# Patient Record
Sex: Female | Born: 1970 | Race: Black or African American | Hispanic: No | Smoking: Never smoker
Health system: Southern US, Community
[De-identification: ages and names within clinical notes are randomized; demographics above are authoritative.]

## PROBLEM LIST (undated history)

## (undated) ENCOUNTER — Emergency Department: Discharge status: Absent without leave | Payer: Self-pay

## (undated) DIAGNOSIS — I10 Essential (primary) hypertension: Secondary | ICD-10-CM

---

## 1999-05-30 ENCOUNTER — Inpatient Hospital Stay (HOSPITAL_COMMUNITY): Admission: AD | Admit: 1999-05-30 | Discharge: 1999-05-30 | Payer: Self-pay | Admitting: Obstetrics & Gynecology

## 2019-01-03 ENCOUNTER — Emergency Department (HOSPITAL_BASED_OUTPATIENT_CLINIC_OR_DEPARTMENT_OTHER): Payer: BC Managed Care – PPO

## 2019-01-03 ENCOUNTER — Emergency Department (HOSPITAL_BASED_OUTPATIENT_CLINIC_OR_DEPARTMENT_OTHER)
Admission: EM | Admit: 2019-01-03 | Discharge: 2019-01-04 | Disposition: A | Discharge status: Home / Self Care | Payer: BC Managed Care – PPO | Attending: Emergency Medicine | Admitting: Emergency Medicine

## 2019-01-03 ENCOUNTER — Encounter (HOSPITAL_BASED_OUTPATIENT_CLINIC_OR_DEPARTMENT_OTHER): Payer: Self-pay | Admitting: *Deleted

## 2019-01-03 ENCOUNTER — Other Ambulatory Visit: Payer: Self-pay

## 2019-01-03 DIAGNOSIS — G479 Sleep disorder, unspecified: Secondary | ICD-10-CM | POA: Diagnosis not present

## 2019-01-03 DIAGNOSIS — R0602 Shortness of breath: Secondary | ICD-10-CM | POA: Diagnosis present

## 2019-01-03 DIAGNOSIS — I10 Essential (primary) hypertension: Secondary | ICD-10-CM | POA: Diagnosis not present

## 2019-01-03 HISTORY — DX: Essential (primary) hypertension: I10

## 2019-01-03 NOTE — ED Triage Notes (Signed)
Feels like she cannot get enough air. She is in no resp distress. She has had 3 episodes of the same. She has an appointment to see her MD tomorrow.

## 2019-01-04 LAB — CBG MONITORING, ED: GLUCOSE-CAPILLARY: 85 mg/dL (ref 70–99)

## 2019-01-04 NOTE — Progress Notes (Signed)
RT ambulated patient while monitoring pulse oximetry. Patient tolerated well. Patient maintained sats 96-1000%,

## 2019-01-04 NOTE — ED Provider Notes (Signed)
[facility] EMERGENCY DEPARTMENT Provider Note   CSN: [id number] Arrival date & time: 01/03/19  2026     History   Chief Complaint No chief complaint on file.   HPI Susan Mayo is a 48 y.o. female.  The history is provided by the patient.  Shortness of Breath  Severity:  Moderate Onset quality:  Gradual Duration:  1 week Timing:  Intermittent Progression:  Worsening Chronicity:  New Relieved by:  Nothing Worsened by:  Nothing Associated symptoms: no abdominal pain, no chest pain, no cough, no fever, no hemoptysis, no PND, no syncope and no vomiting   Associated symptoms comment:  "indigestion"  Risk factors: no hx of PE/DVT, no prolonged immobilization, no recent surgery and no tobacco use   Patient reports episodes of shortness of breath for over a week.  It is intermittent. She reports it for started around February 3. She will have episodes where she cannot get enough air.  She also has episodes of indigestion.  No fever/vomiting.  No hemoptysis.  No other chest pain.  No leg swelling.   No travel. No previous cardiac history.  No family history of premature cardiac disease Past Medical History:  Diagnosis Date  . Hypertension     There are no active problems to display for this patient.   History reviewed. No pertinent surgical history.   OB History   No obstetric history on file.      Home Medications    Prior to Admission medications   Medication Sig Start Date End Date Taking? Authorizing Provider  Ferrous Sulfate (IRON PO) Take by mouth.   Yes [provider]  Potassium (POTASSIMIN PO) Take by mouth.   Yes [provider]    Family History No family history on file.  Social History Social History   Tobacco Use  . Smoking status: Never Smoker  . Smokeless tobacco: Never Used  Substance Use Topics  . Alcohol use: Not Currently  . Drug use: Never     Allergies   Patient has no known allergies.   Review  of Systems Review of Systems  Constitutional: Negative for fever.  Respiratory: Positive for shortness of breath. Negative for cough and hemoptysis.   Cardiovascular: Negative for chest pain, leg swelling, syncope and PND.  Gastrointestinal: Negative for abdominal pain and vomiting.  Neurological:       Intermittent "tingling" in her left hand with computer use  Psychiatric/Behavioral: Positive for sleep disturbance.  All other systems reviewed and are negative.    Physical Exam Updated Vital Signs BP 118/64 (BP Location: Left Arm)   Pulse 78   Temp 98.7 F (37.1 C) (Oral)   Resp 18   Ht 1.676 m (5\' 6" )   Wt 91.6 kg   LMP 12/27/2018   SpO2 100%   BMI 32.60 kg/m   Physical Exam CONSTITUTIONAL: Well developed/well nourished HEAD: Normocephalic/atraumatic EYES: EOMI/PERRL ENMT: Mucous membranes moist NECK: supple no meningeal signs, no JVD SPINE/BACK:entire spine nontender CV: S1/S2 noted, no murmurs/rubs/gallops noted LUNGS: Lungs are clear to auscultation bilaterally, no apparent distress ABDOMEN: soft, nontender, no rebound or guarding, bowel sounds noted throughout abdomen GU:no cva tenderness NEURO: Pt is awake/alert/appropriate, moves all extremitiesx4.  No facial droop.   EXTREMITIES: pulses normal/equal, full ROM, no lower extremity edema SKIN: warm, color normal PSYCH: no abnormalities of mood noted, alert and oriented to situation  ED Treatments / Results  Labs (all labs ordered are listed, but only abnormal results are displayed) Labs Reviewed  CBG MONITORING, ED    EKG EKG Interpretation  Date/Time:  Monday January 03 2019 20:44:10 EST Ventricular Rate:  82 PR Interval:  164 QRS Duration: 82 QT Interval:  376 QTC Calculation: 439 R Axis:   81 Text Interpretation:  Normal sinus rhythm with sinus arrhythmia Normal ECG No previous ECGs available Confirmed by Zadie RhineWickline, Reon Hunley ([id number]) on 01/03/2019 11:31:47 PM   Radiology Dg Chest 2 View  Result  Date: 01/03/2019 CLINICAL DATA:  Worsening shortness of breath for 7 days EXAM: CHEST - 2 VIEW COMPARISON:  None. FINDINGS: The heart size and mediastinal contours are within normal limits. Both lungs are clear. The visualized skeletal structures are unremarkable. IMPRESSION: No active cardiopulmonary disease. Electronically Signed   By: Alcide CleverMark  Lukens M.D.   On: 01/03/2019 23:59    Procedures Procedures (including critical care time)  Medications Ordered in ED Medications - No data to display   Initial Impression / Assessment and Plan / ED Course  I have reviewed the triage vital signs and the nursing notes.  Pertinent labs & imaging results that were available during my care of the patient were reviewed by me and considered in my medical decision making (see chart for details).     12:07 AM Presents for episodes of unable to get air/shortness of breath.  She is currently PERC negative. She is well-appearing.  EKG is unremarkable.  She does report recent sleep disturbance and some anxiety.  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 My suspicion for ACS/PE is low She has PCP followup later today 12:42 AM Ambulated without difficulty.  No hypoxia.  She is overall well-appearing.  Will discharge home. Final Clinical Impressions(s) / ED Diagnoses   Final diagnoses:  Shortness of breath    ED Discharge Orders    None       Zadie RhineWickline, Briscoe Daniello, MD 01/04/19 [id number]

## 2019-12-22 IMAGING — DX DG CHEST 2V
2 series · 2 of 2 positions shown · non-contrast
Comparison: None.

CLINICAL DATA: Worsening shortness of breath for 7 days

EXAM:
CHEST - 2 VIEW

[chest pa]
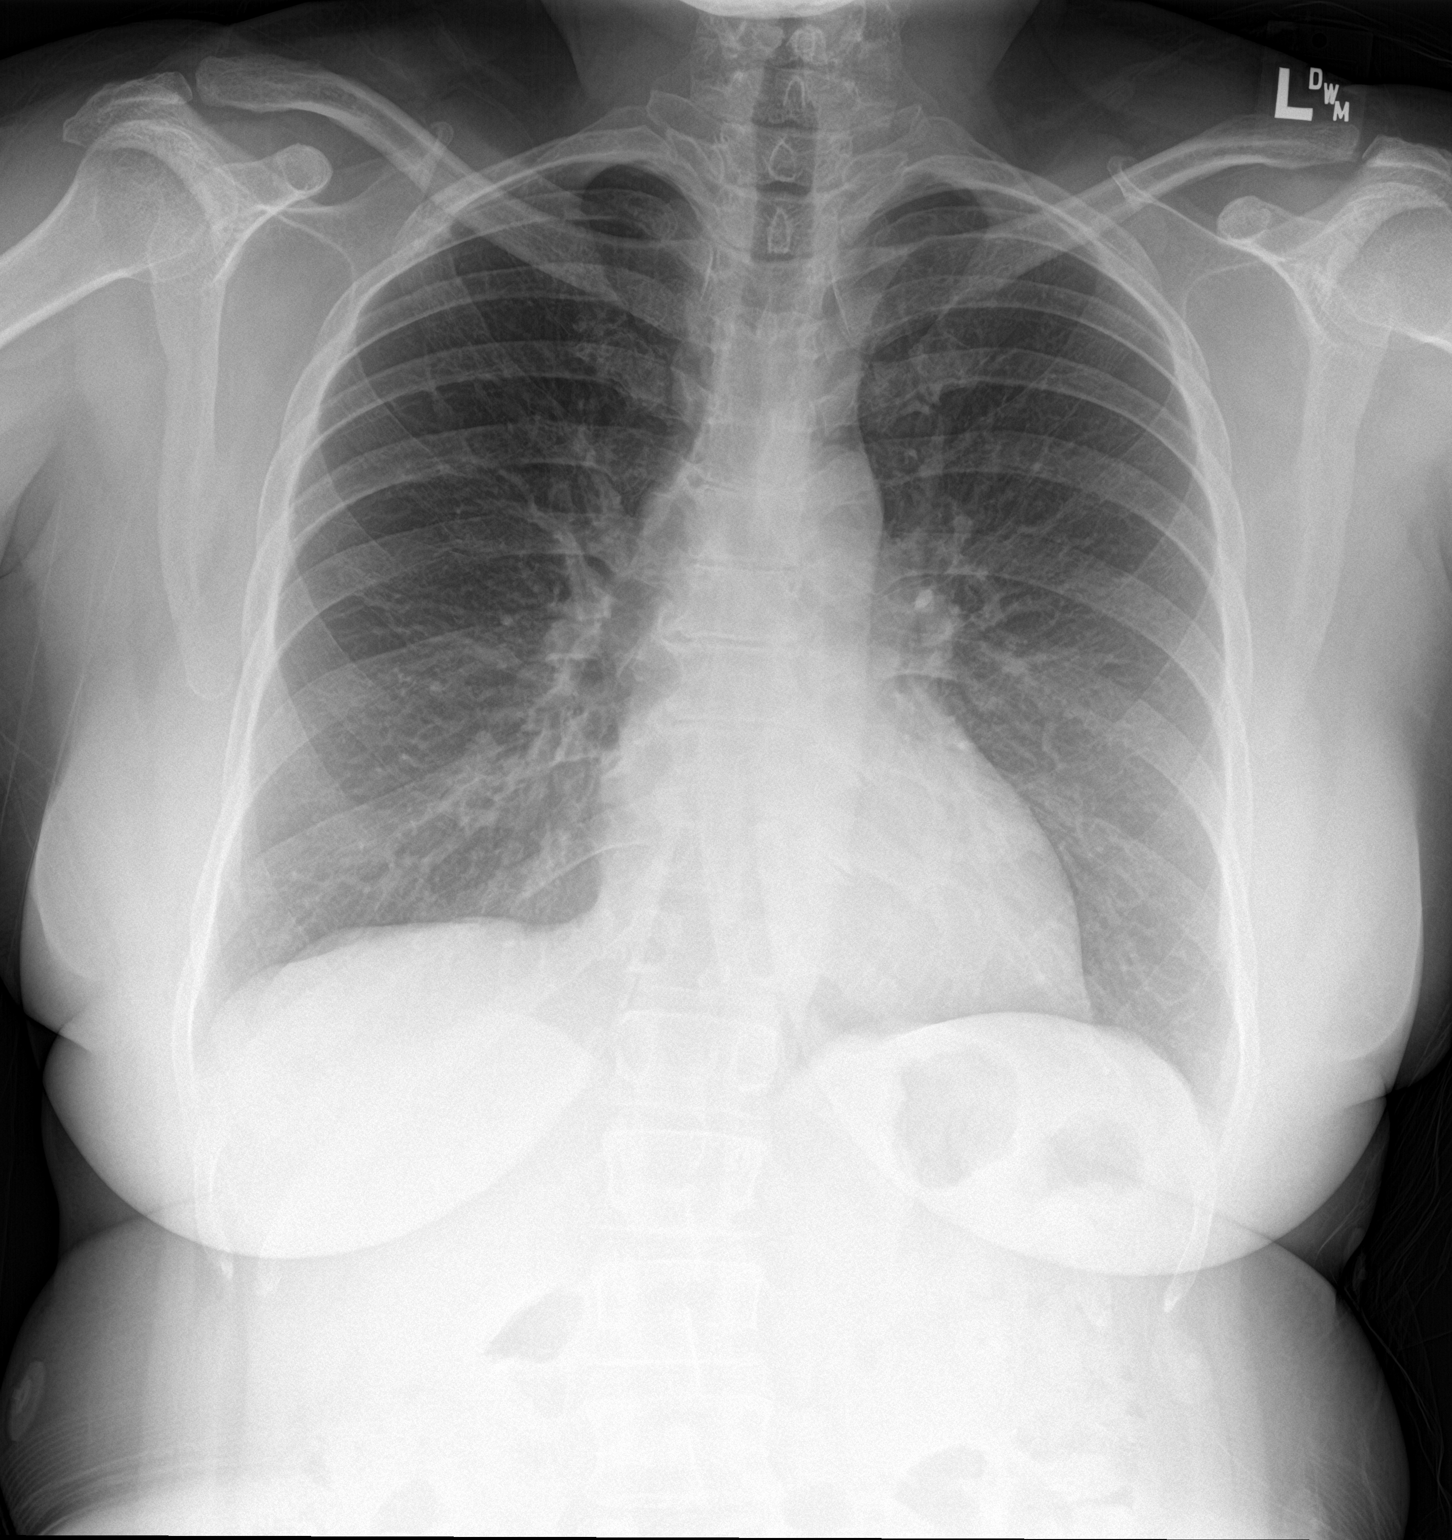

[chest lat]
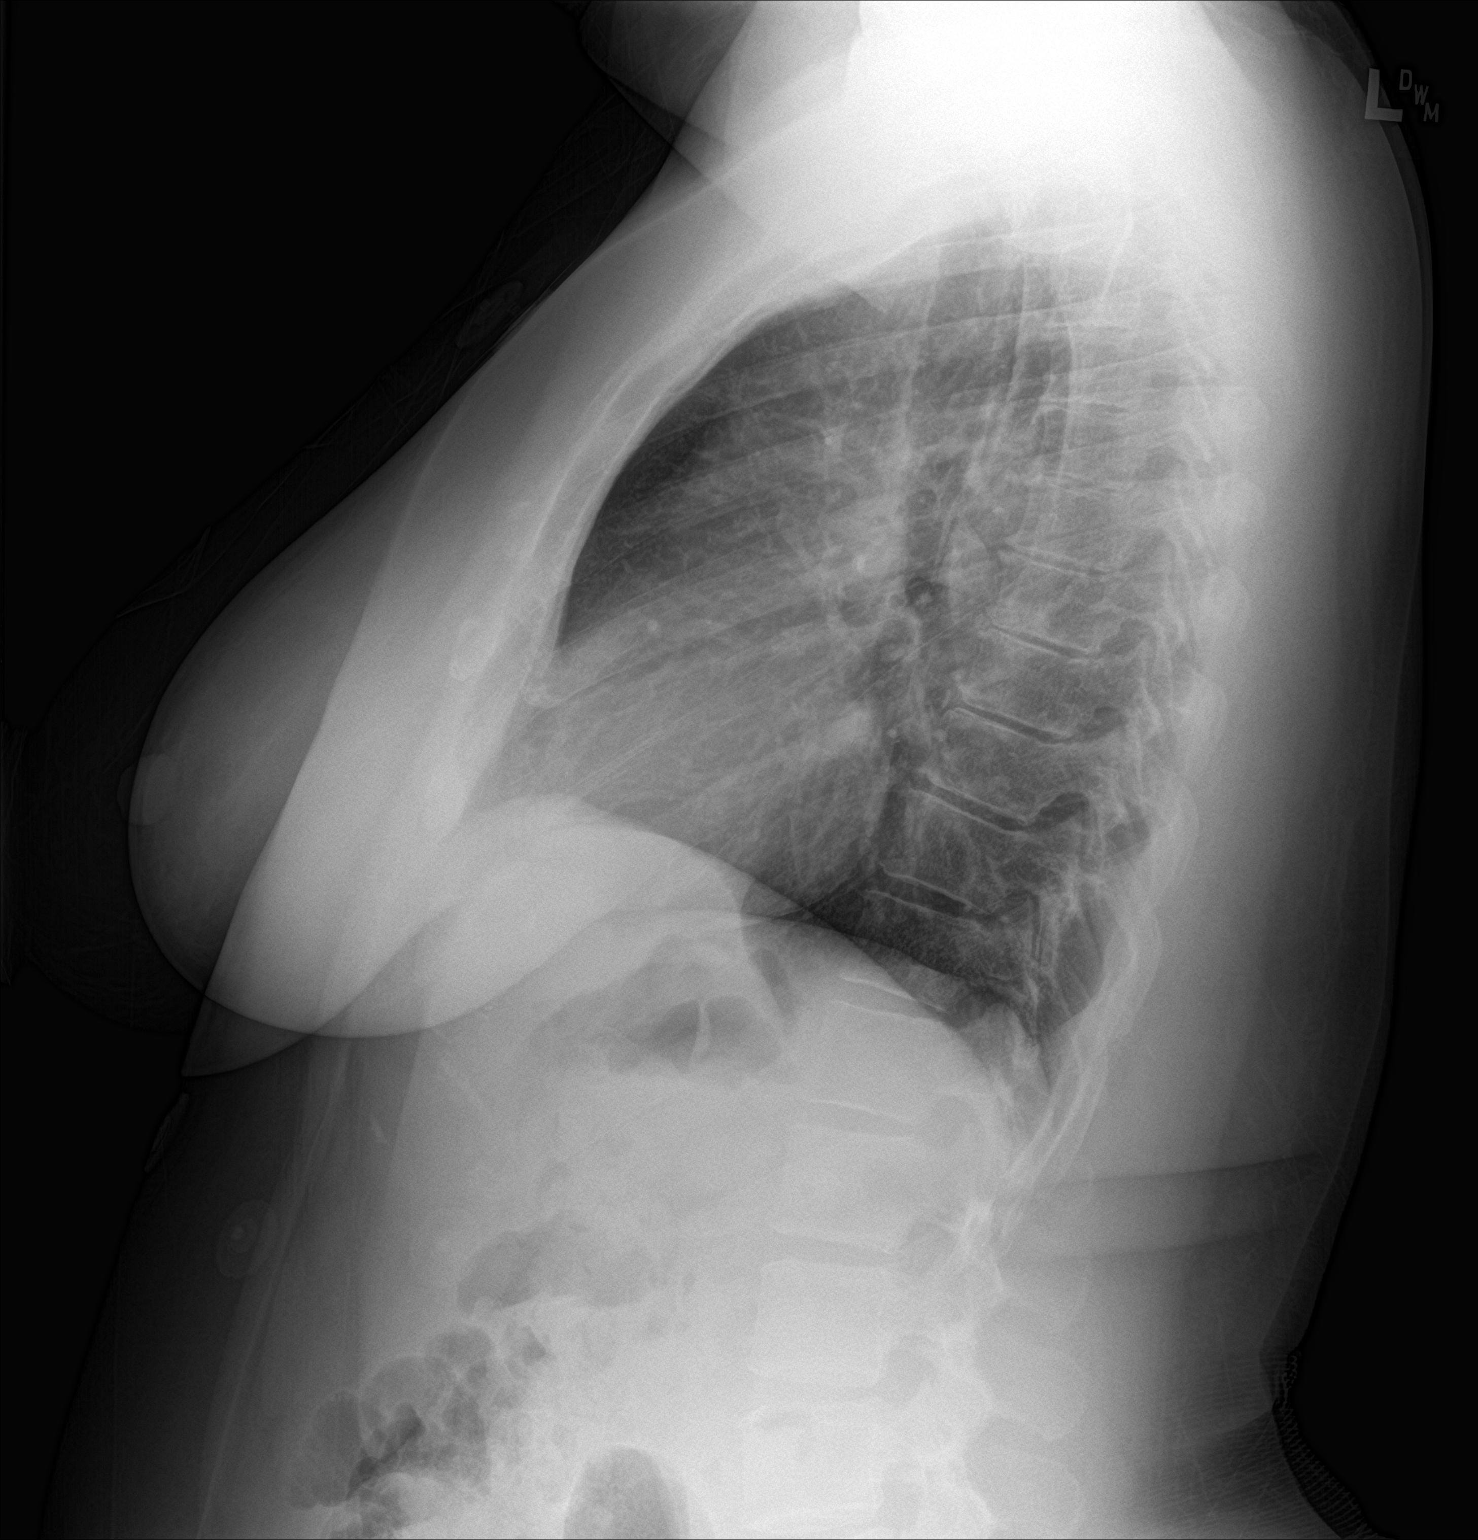

[2 of 2 positions shown; findings below may reference images not displayed]

FINDINGS: The heart size and mediastinal contours are within normal limits.
Both lungs are clear. The visualized skeletal structures are
unremarkable.
IMPRESSION: No active cardiopulmonary disease.

## 2021-06-04 ENCOUNTER — Other Ambulatory Visit: Payer: Self-pay

## 2021-06-04 ENCOUNTER — Other Ambulatory Visit (HOSPITAL_COMMUNITY): Payer: Self-pay | Admitting: Medical

## 2021-06-04 ENCOUNTER — Ambulatory Visit (HOSPITAL_COMMUNITY)
Admission: RE | Admit: 2021-06-04 | Discharge: 2021-06-04 | Disposition: A | Discharge status: Home / Self Care | Payer: BC Managed Care – PPO | Source: Ambulatory Visit | Attending: Medical | Admitting: Medical

## 2021-06-04 DIAGNOSIS — M79604 Pain in right leg: Secondary | ICD-10-CM | POA: Insufficient documentation

## 2021-06-04 DIAGNOSIS — M79605 Pain in left leg: Secondary | ICD-10-CM | POA: Insufficient documentation

## 2021-06-04 DIAGNOSIS — M79661 Pain in right lower leg: Secondary | ICD-10-CM | POA: Diagnosis not present

## 2021-06-04 DIAGNOSIS — M79662 Pain in left lower leg: Secondary | ICD-10-CM | POA: Diagnosis not present

## 2021-06-04 DIAGNOSIS — M79669 Pain in unspecified lower leg: Secondary | ICD-10-CM | POA: Diagnosis present

## 2021-06-04 DIAGNOSIS — M7989 Other specified soft tissue disorders: Secondary | ICD-10-CM | POA: Diagnosis present

## 2021-06-04 NOTE — Progress Notes (Signed)
Bilateral lower extremity venous duplex completed. Refer to "CV Proc" under chart review to view preliminary results.  06/04/2021 2:44 PM Eula Fried., MHA, RVT, RDCS, RDMS

## 2021-06-05 ENCOUNTER — Encounter (HOSPITAL_COMMUNITY): Payer: BC Managed Care – PPO

## 2022-10-23 ENCOUNTER — Other Ambulatory Visit: Payer: Self-pay | Admitting: Internal Medicine

## 2022-10-23 DIAGNOSIS — Z1231 Encounter for screening mammogram for malignant neoplasm of breast: Secondary | ICD-10-CM

## 2022-12-19 ENCOUNTER — Ambulatory Visit: Payer: BC Managed Care – PPO

## 2023-02-05 ENCOUNTER — Ambulatory Visit: Payer: BC Managed Care – PPO

## 2023-03-23 ENCOUNTER — Ambulatory Visit
Admission: RE | Admit: 2023-03-23 | Discharge: 2023-03-23 | Disposition: A | Discharge status: Home / Self Care | Payer: Self-pay | Source: Ambulatory Visit | Attending: Internal Medicine | Admitting: Internal Medicine

## 2023-03-23 DIAGNOSIS — Z1231 Encounter for screening mammogram for malignant neoplasm of breast: Secondary | ICD-10-CM

## 2023-10-29 ENCOUNTER — Other Ambulatory Visit: Payer: Self-pay | Admitting: Internal Medicine

## 2023-10-29 ENCOUNTER — Ambulatory Visit
Admission: RE | Admit: 2023-10-29 | Discharge: 2023-10-29 | Disposition: A | Discharge status: Home / Self Care | Payer: Commercial Managed Care - PPO | Source: Ambulatory Visit | Attending: Internal Medicine | Admitting: Internal Medicine

## 2023-10-29 DIAGNOSIS — M79675 Pain in left toe(s): Secondary | ICD-10-CM

## 2023-12-24 ENCOUNTER — Ambulatory Visit (INDEPENDENT_AMBULATORY_CARE_PROVIDER_SITE_OTHER): Payer: Commercial Managed Care - PPO | Admitting: Podiatry

## 2023-12-24 ENCOUNTER — Ambulatory Visit (INDEPENDENT_AMBULATORY_CARE_PROVIDER_SITE_OTHER): Payer: Commercial Managed Care - PPO

## 2023-12-24 ENCOUNTER — Encounter: Payer: Self-pay | Admitting: Podiatry

## 2023-12-24 VITALS — Ht 66.0 in

## 2023-12-24 DIAGNOSIS — M79672 Pain in left foot: Secondary | ICD-10-CM | POA: Diagnosis not present

## 2023-12-24 DIAGNOSIS — M205X2 Other deformities of toe(s) (acquired), left foot: Secondary | ICD-10-CM

## 2023-12-24 NOTE — Patient Instructions (Signed)
VISIT SUMMARY:  Today, we discussed your foot pain and difficulty wearing shoes due to arthritis and a bunion deformity in your left big toe. We reviewed your symptoms, family history, and previous x-ray results.  YOUR PLAN:  -HALLUX LIMITUS WITH BUNION DEFORMITY: Hallux limitus is a condition where the big toe has limited movement due to arthritis, and a bunion deformity is a bony bump that forms on the joint at the base of the big toe. We discussed both surgical and non-surgical treatment options. For temporary relief, we can consider a kylectomy, which is a procedure to remove bone spurs and increase range of motion. For a more permanent solution, we may consider arthrodesis, which is a surgical fusion of the joint. Please return for follow-up as needed.  INSTRUCTIONS:  Please return for follow-up as needed to discuss your treatment options and monitor your condition.

## 2023-12-25 ENCOUNTER — Encounter: Payer: Self-pay | Admitting: Podiatry

## 2023-12-25 NOTE — Progress Notes (Signed)
  Subjective:  Patient ID: Susan Mayo, female    DOB: [birth year],  MRN: [id number]  Chief Complaint  Patient presents with   Foot Pain    Left foot , She had xrays in December in [facility] and having a lot of pain.     Discussed the use of AI scribe software for clinical note transcription with the patient, who gave verbal consent to proceed.  History of Present Illness   The patient is a 53 year old with arthritis who presents with foot pain and difficulty wearing shoes.  She experiences significant pain in the big toe, describing it as feeling 'jammed.' Wearing anything other than flat shoes exacerbates the pain, with even slightly elevated shoes leading to severe discomfort. An incident with flat boots resulted in being unable to attend a Christmas party the following day. Currently, she can only wear tennis shoes and flat shoes due to pain caused by other types of footwear, expressing concern about the inability to wear pumps and the impact on her lifestyle.  Previous x-rays were taken but were not weight-bearing, and there is uncertainty about whether she was standing during the process. She has been informed of arthritis in the joint, with a bone spur and a bone cyst present, which could be contributing to her pain.  There is no known family history of gout, but there is a family history of foot issues. Her mother and grandmother had multiple foot surgeries, including procedures for hammer toes and joint implants.          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 Grade two hallux limitus with dorsal spurring and subchondral cyst formation. Hallux valgus deformity. Better range of motion on the right side compared to the left. Palpable dorsal spurring.       No images are attached to the encounter.    Results   RADIOLOGY Foot X-ray (10/29/2023): Non-weight bearing. Arthritis, bone spur, bone cyst. Foot  X-ray (12/24/2023): Weight bearing. Grade two hallux limitus, dorsal spurring, subchondral cyst formation.      Assessment:   1. Hallux limitus, left   2. Left foot pain      Plan:  Patient was evaluated and treated and all questions answered.  Assessment and Plan    Hallux Limitus with Bunion Deformity Pain and limited range of motion in the left big toe due to arthritis and bunion deformity. Family history of foot issues. X-rays show grade 2 hallux limitus with dorsal spurring and subchondral cyst formation. Discussed surgical and non-surgical treatment options. -Discussed option of a Cheilectomy to increase range of motion and provide temporary relief. -Long term like will need future arthrodesis for a more permanent correction. -Patient to return as needed for follow-up when she is ready to proceed. -Also discussed corticosteroid injection which would alleviate pain but not eliminate the issue          No follow-ups on file.

## 2024-09-27 ENCOUNTER — Other Ambulatory Visit: Payer: Self-pay | Admitting: Internal Medicine

## 2024-09-27 DIAGNOSIS — Z1231 Encounter for screening mammogram for malignant neoplasm of breast: Secondary | ICD-10-CM
